# Patient Record
Sex: Female | Born: 1970 | Race: Asian | Hispanic: No | Marital: Married | Smoking: Never smoker
Health system: Southern US, Community
[De-identification: ages and names within clinical notes are randomized; demographics above are authoritative.]

---

## 2020-07-13 ENCOUNTER — Ambulatory Visit (INDEPENDENT_AMBULATORY_CARE_PROVIDER_SITE_OTHER): Payer: 59 | Admitting: Dermatology

## 2020-07-13 ENCOUNTER — Other Ambulatory Visit: Payer: Self-pay

## 2020-07-13 DIAGNOSIS — L821 Other seborrheic keratosis: Secondary | ICD-10-CM | POA: Diagnosis not present

## 2020-07-13 DIAGNOSIS — L819 Disorder of pigmentation, unspecified: Secondary | ICD-10-CM

## 2020-07-13 DIAGNOSIS — L578 Other skin changes due to chronic exposure to nonionizing radiation: Secondary | ICD-10-CM

## 2020-07-13 DIAGNOSIS — L814 Other melanin hyperpigmentation: Secondary | ICD-10-CM

## 2020-07-13 NOTE — Patient Instructions (Addendum)
[facility] Hydroquinone 12%/kojic acid/vitamin C cream pea sized amount twice daily to the entire face for up to 3 months. This cannot be used more than 3 months due to risk of exogenous ochronosis (permanent dark spots).   Instructions for [facility] Medications  One or more of your medications was sent to the [facility] mail order compounding pharmacy. You will receive an email from them and can purchase the medicine through that link. It will then be mailed to your home at the address you confirmed. If for any reason you do not receive an email from them, please check your spam folder. If you still do not find the email, please let us know. [facility] phone number is [phone].  Recommend taking Heliocare sun protection supplement daily in sunny weather for additional sun protection. For maximum protection on the sunniest days, you can take up to 2 capsules of regular Heliocare OR take 1 capsule of Heliocare Ultra. For prolonged exposure (such as a full day in the sun), you can repeat your dose of the supplement 4 hours after your first dose. Heliocare can be purchased at [facility] or at [email or web address].   Melanoma ABCDEs  Melanoma is the most dangerous type of skin cancer, and is the leading cause of death from skin disease.  You are more likely to develop melanoma if you:  Have light-colored skin, light-colored eyes, or red or blond hair  Spend a lot of time in the sun  Tan regularly, either outdoors or in a tanning bed  Have had blistering sunburns, especially during childhood  Have a close family member who has had a melanoma  Have atypical moles or large birthmarks  Early detection of melanoma is key since treatment is typically straightforward and cure rates are extremely high if we catch it early.   The first sign of melanoma is often a change in a mole or a new dark spot.  The ABCDE system is a way of remembering the signs of melanoma.  A for  asymmetry:  The two halves do not match. B for border:  The edges of the growth are irregular. C for color:  A mixture of colors are present instead of an even brown color. D for diameter:  Melanomas are usually (but not always) greater than 74mm - the size of a pencil eraser. E for evolution:  The spot keeps changing in size, shape, and color.  Please check your skin once per month between visits. You can use a small mirror in front and a large mirror behind you to keep an eye on the back side or your body.   If you see any new or changing lesions before your next follow-up, please call to schedule a visit.  Please continue daily skin protection including broad spectrum tinted sunscreen SPF 30+ to sun-exposed areas, reapplying every 2 hours as needed when you're outdoors.   Bare minerals tinted spf use daily

## 2020-07-13 NOTE — Progress Notes (Signed)
   New Patient Visit  Subjective  Laura Jennings is a 50 y.o. female who presents for the following: New Patient (Initial Visit) (Patient has some concerns with black spots on face. Patient noticed over a year ago. She reports no other concerns at this time.    Objective  Well appearing patient in no apparent distress; mood and affect are within normal limits.  A focused examination was performed including face, neck, eyelids, ears, hands. Relevant physical exam findings are noted in the Assessment and Plan.  Objective  Head - Anterior (Face): Hyperpigmented patches at chin and forehead  Assessment & Plan    Seborrheic Keratoses - Stuck-on, waxy, tan-brown papules and plaques  - Discussed benign etiology and prognosis. - Observe - Call for any changes  Lentigines - Scattered tan macules - Discussed due to sun exposure - Benign, observe - Recommend daily broad spectrum sunscreen SPF 30+ to sun-exposed areas, reapply every 2 hours as needed. 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 Condition is bothersome to patient. Currently flared.  Differential diagnosis includes erythema dyschromicum perstans vs lichen planus pigmentosis over melasma  She denies any topicals other than hyaluronic acid (The ordinary brand) She denies medications   Will prescribe [facility] Hydroquinone 12%/kojic acid/vitamin C cream pea sized amount twice daily to the entire face for up to 3 months. This cannot be used more than 3 months due to risk of exogenous ochronosis (permanent dark spots). The patient was advised this is not covered by insurance since it is made by a compounding pharmacy. They will receive an email to check out and the medication will be mailed to their home.   Recommend tinted mineral SPF 30+ sunscreen daily, reapply every 2 hours when in sun  If not improving may consider biopsy at  follow up vs trial of calcineurin inhibitor or topical steroid  Actinic Damage - chronic, secondary to cumulative UV radiation exposure/sun exposure over time - diffuse scaly erythematous macules with underlying dyspigmentation - Recommend daily broad spectrum sunscreen SPF 30+ to sun-exposed areas, reapply every 2 hours as needed.  - Call for new or changing lesions.  Return in about 6 weeks (around 08/24/2020) for follow up on hyperpigmentation .  I, Asher Muir, CMA, am acting as scribe for Darden Dates, MD.  Documentation: I have reviewed the above documentation for accuracy and completeness, and I agree with the above.  Darden Dates, MD

## 2020-07-17 ENCOUNTER — Encounter: Payer: Self-pay | Admitting: Dermatology

## 2020-08-24 ENCOUNTER — Other Ambulatory Visit: Payer: Self-pay

## 2020-08-24 ENCOUNTER — Ambulatory Visit (INDEPENDENT_AMBULATORY_CARE_PROVIDER_SITE_OTHER): Payer: 59 | Admitting: Dermatology

## 2020-08-24 DIAGNOSIS — L814 Other melanin hyperpigmentation: Secondary | ICD-10-CM | POA: Diagnosis not present

## 2020-08-24 DIAGNOSIS — L821 Other seborrheic keratosis: Secondary | ICD-10-CM | POA: Diagnosis not present

## 2020-08-24 NOTE — Progress Notes (Signed)
   Follow-Up Visit   Subjective  Laura Jennings is a 50 y.o. female who presents for the following: Follow-up (Patient here today for 6 week follow up on melasma. She has been using [facility] melasma twice daily for about 4 weeks. Patient advises there is some improvement at cheeks but other areas are still dark and she has noticed some dryness around eyes. ).  The following portions of the chart were reviewed this encounter and updated as appropriate:   Allergies  Meds  Problems  Med Hx  Surg Hx  Fam Hx      Review of Systems:  No other skin or systemic complaints except as noted in HPI or Assessment and Plan.  Objective  Well appearing patient in no apparent distress; mood and affect are within normal limits.  A focused examination was performed including face. Relevant physical exam findings are noted in the Assessment and Plan.  Objective  face: Hyperpigmented patches  Images         Assessment & Plan  Other melanin hyperpigmentation face  Present over one year (chronic). Flared.   Differential diagnosis includes erythema dyschromicum perstans vs lichen planus pigmentosis over melasma  D/c hydroquinone 12%/kojic acid/vitamin C (has used for 1 month)  Start [facility] Hydroquinone 8%, Tretinoin 0.025%, Kojic acid 1%, Niacinamide 4%, Fluocinolone 0.025% cream, a pea sized amount nightly to dark spots on face for up to 2 months. This cannot be used more than 3 months due to risk of skin atrophy (thinning) and exogenous ochronosis (permanent dark spots). The patient was advised this is not covered by insurance. They will receive an email to check out and the medication will be mailed to their home.  Recommend daily sunscreen.  Recommend Vanicream gentle cleanser and moisturizer  Seborrheic Keratoses - Stuck-on, waxy, tan-brown papules and plaques  - Discussed benign etiology and prognosis. - Observe - Call for any changes  Return in about 2 months  (around 10/24/2020).  Anise Salvo, RMA, am acting as scribe for Darden Dates, MD .  Documentation: I have reviewed the above documentation for accuracy and completeness, and I agree with the above.  Darden Dates, MD

## 2020-08-24 NOTE — Patient Instructions (Addendum)
Start [facility] Hydroquinone 8%, Tretinoin 0.025%, Kojic acid 1%, Niacinamide 4%, Fluocinolone 0.025% cream, a pea sized amount nightly  to dark spots on face for up to 2 months. This cannot be used more than 3 months due to risk of skin atrophy (thinning) and exogenous ochronosis (permanent dark spots). The patient was advised this is not covered by insurance. They will receive an email to check out and the medication will be mailed to their home.   Gentle Skin Care Guide  1. Bathe no more than once a day.  2. Avoid bathing in hot water  3. Use a mild soap like Dove, Vanicream, Cetaphil, CeraVe. Can use Lever 2000 or Cetaphil antibacterial soap  4. Use soap only where you need it. On most days, use it under your arms, between your legs, and on your feet. Let the water rinse other areas unless visibly dirty.  5. When you get out of the bath/shower, use a towel to gently blot your skin dry, don't rub it.  6. While your skin is still a little damp, apply a moisturizing cream such as Vanicream, CeraVe, Cetaphil, Eucerin, Sarna lotion or plain Vaseline Jelly. For hands apply Neutrogena [place] Hand Cream or Excipial Hand Cream.  7. Reapply moisturizer any time you start to itch or feel dry.  8. Sometimes using free and clear laundry detergents can be helpful. Fabric softener sheets should be avoided. Downy Free & Gentle liquid, or any liquid fabric softener that is free of dyes and perfumes, it acceptable to use  9. If your doctor has given you prescription creams you may apply moisturizers over them    Recommend daily sunscreen.

## 2020-08-27 ENCOUNTER — Encounter: Payer: Self-pay | Admitting: Dermatology

## 2020-11-09 ENCOUNTER — Ambulatory Visit: Payer: 59 | Admitting: Dermatology

## 2021-05-01 ENCOUNTER — Emergency Department (HOSPITAL_BASED_OUTPATIENT_CLINIC_OR_DEPARTMENT_OTHER): Payer: Managed Care, Other (non HMO)

## 2021-05-01 ENCOUNTER — Encounter (HOSPITAL_BASED_OUTPATIENT_CLINIC_OR_DEPARTMENT_OTHER): Payer: Self-pay

## 2021-05-01 ENCOUNTER — Other Ambulatory Visit (HOSPITAL_BASED_OUTPATIENT_CLINIC_OR_DEPARTMENT_OTHER): Payer: Self-pay

## 2021-05-01 ENCOUNTER — Other Ambulatory Visit: Payer: Self-pay

## 2021-05-01 ENCOUNTER — Emergency Department (HOSPITAL_BASED_OUTPATIENT_CLINIC_OR_DEPARTMENT_OTHER)
Admission: EM | Admit: 2021-05-01 | Discharge: 2021-05-01 | Disposition: A | Discharge status: Home / Self Care | Payer: Managed Care, Other (non HMO) | Attending: Emergency Medicine | Admitting: Emergency Medicine

## 2021-05-01 DIAGNOSIS — M79641 Pain in right hand: Secondary | ICD-10-CM | POA: Diagnosis not present

## 2021-05-01 DIAGNOSIS — S62664A Nondisplaced fracture of distal phalanx of right ring finger, initial encounter for closed fracture: Secondary | ICD-10-CM | POA: Insufficient documentation

## 2021-05-01 DIAGNOSIS — S299XXA Unspecified injury of thorax, initial encounter: Secondary | ICD-10-CM | POA: Diagnosis present

## 2021-05-01 DIAGNOSIS — R079 Chest pain, unspecified: Secondary | ICD-10-CM | POA: Diagnosis not present

## 2021-05-01 DIAGNOSIS — M79642 Pain in left hand: Secondary | ICD-10-CM | POA: Diagnosis not present

## 2021-05-01 DIAGNOSIS — S2232XA Fracture of one rib, left side, initial encounter for closed fracture: Secondary | ICD-10-CM | POA: Insufficient documentation

## 2021-05-01 DIAGNOSIS — Y92410 Unspecified street and highway as the place of occurrence of the external cause: Secondary | ICD-10-CM | POA: Diagnosis not present

## 2021-05-01 DIAGNOSIS — S270XXA Traumatic pneumothorax, initial encounter: Secondary | ICD-10-CM | POA: Diagnosis not present

## 2021-05-01 DIAGNOSIS — S199XXA Unspecified injury of neck, initial encounter: Secondary | ICD-10-CM | POA: Diagnosis not present

## 2021-05-01 DIAGNOSIS — T1490XA Injury, unspecified, initial encounter: Secondary | ICD-10-CM

## 2021-05-01 LAB — COMPREHENSIVE METABOLIC PANEL
ALT: 8 U/L (ref 0–44)
AST: 21 U/L (ref 15–41)
Albumin: 4.2 g/dL (ref 3.5–5.0)
Alkaline Phosphatase: 63 U/L (ref 38–126)
Anion gap: 12 (ref 5–15)
BUN: 11 mg/dL (ref 6–20)
CO2: 19 mmol/L — ABNORMAL LOW (ref 22–32)
Calcium: 9.3 mg/dL (ref 8.9–10.3)
Chloride: 106 mmol/L (ref 98–111)
Creatinine, Ser: 0.69 mg/dL (ref 0.44–1.00)
GFR, Estimated: >60 mL/min (ref >60)
Glucose, Bld: 102 mg/dL — ABNORMAL HIGH (ref 70–99)
Potassium: 4.3 mmol/L (ref 3.5–5.1)
Sodium: 137 mmol/L (ref 135–145)
Total Bilirubin: 0.3 mg/dL (ref 0.3–1.2)
Total Protein: 7.7 g/dL (ref 6.5–8.1)

## 2021-05-01 LAB — RAPID URINE DRUG SCREEN, HOSP PERFORMED
Amphetamines: NOT DETECTED
Barbiturates: NOT DETECTED
Benzodiazepines: NOT DETECTED
Cocaine: NOT DETECTED
Opiates: NOT DETECTED
Tetrahydrocannabinol: NOT DETECTED

## 2021-05-01 LAB — CBC
HCT: 37.1 % (ref 36.0–46.0)
Hemoglobin: 11.7 g/dL — ABNORMAL LOW (ref 12.0–15.0)
MCH: 26.7 pg (ref 26.0–34.0)
MCHC: 31.5 g/dL (ref 30.0–36.0)
MCV: 84.7 fL (ref 80.0–100.0)
Platelets: 308 10*3/uL (ref 150–400)
RBC: 4.38 MIL/uL (ref 3.87–5.11)
RDW: 14.6 % (ref 11.5–15.5)
WBC: 13.6 10*3/uL — ABNORMAL HIGH (ref 4.0–10.5)
nRBC: 0 % (ref 0.0–0.2)

## 2021-05-01 LAB — PROTIME-INR
INR: 1 (ref 0.8–1.2)
Prothrombin Time: 13.4 seconds (ref 11.4–15.2)

## 2021-05-01 LAB — ETHANOL: Alcohol, Ethyl (B): 12 mg/dL — ABNORMAL HIGH (ref <10)

## 2021-05-01 LAB — HCG, QUANTITATIVE, PREGNANCY: hCG, Beta Chain, Quant, S: <1 m[IU]/mL (ref <5)

## 2021-05-01 MED ORDER — IOHEXOL 300 MG/ML  SOLN
85.0000 mL | Freq: Once | INTRAMUSCULAR | Status: AC | PRN
  Administered 2021-05-01: 85 mL via INTRAVENOUS

## 2021-05-01 MED ORDER — FENTANYL CITRATE PF 50 MCG/ML IJ SOSY
50.0000 ug | PREFILLED_SYRINGE | Freq: Once | INTRAMUSCULAR | Status: AC
  Administered 2021-05-01: 50 ug via INTRAVENOUS
  Filled 2021-05-01: qty 1

## 2021-05-01 MED ORDER — IBUPROFEN 600 MG PO TABS
600.0000 mg | ORAL_TABLET | Freq: Four times a day (QID) | ORAL | 0 refills | Status: AC | PRN
  Filled 2021-05-01: qty 30, 8d supply, fill #0

## 2021-05-01 MED ORDER — SODIUM CHLORIDE 0.9 % IV BOLUS
1000.0000 mL | Freq: Once | INTRAVENOUS | Status: AC
  Administered 2021-05-01: 1000 mL via INTRAVENOUS

## 2021-05-01 MED ORDER — METHOCARBAMOL 500 MG PO TABS
1000.0000 mg | ORAL_TABLET | Freq: Three times a day (TID) | ORAL | 0 refills | Status: AC | PRN
  Filled 2021-05-01: qty 20, 4d supply, fill #0

## 2021-05-01 MED ORDER — OXYCODONE HCL 5 MG PO TABS
5.0000 mg | ORAL_TABLET | ORAL | 0 refills | Status: AC | PRN
  Filled 2021-05-01: qty 15, 3d supply, fill #0

## 2021-05-01 MED ORDER — ACETAMINOPHEN 500 MG PO TABS
1000.0000 mg | ORAL_TABLET | Freq: Four times a day (QID) | ORAL | 0 refills | Status: AC | PRN
  Filled 2021-05-01: qty 56, 7d supply, fill #0
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
## 2021-05-01 NOTE — ED Notes (Signed)
Patient ambulated per MD order on RA. Patient O2 sat at lowest was 93%, but remained 95-100% for most of the ambulation attempt. HR remained in the 80's. Patient endorses shob with ambulation. Patient returned to room and replaced on monitor. Will make MD aware.

## 2021-05-01 NOTE — ED Notes (Signed)
Patient ordered IS for rib fracture. Spoke with EDP about IS and contraindications of IS w/ present pneumothorax. Dr. Julieanne Manson spoke w/ Dr. Bedelia Person (Trauma MD) and okay to initiate IS at this time.
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
## 2021-05-01 NOTE — ED Notes (Signed)
This RN presented the AVS utilizing Teachback Method. Patient verbalizes understanding of Discharge Instructions. Opportunity for Questioning and Answers were provided. Patient Discharged from ED ambulatory to home with Family.

## 2021-05-01 NOTE — ED Notes (Signed)
Patient instructed on IS. Patient currently in pain with deep breathing. Patient used best effort and obtained 500 on IS. Patient acknowledge understanding and no further questions at this time.
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
## 2021-05-09 ENCOUNTER — Ambulatory Visit: Payer: 59 | Admitting: Dermatology
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
